# Patient Record
Sex: Female | Born: 2000 | Race: White | Hispanic: No | Marital: Single
Health system: Southern US, Community
[De-identification: ages and names within clinical notes are randomized; demographics above are authoritative.]

---
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
## 2020-04-09 DIAGNOSIS — Z419 Encounter for procedure for purposes other than remedying health state, unspecified: Secondary | ICD-10-CM | POA: Diagnosis not present

## 2020-05-10 DIAGNOSIS — Z419 Encounter for procedure for purposes other than remedying health state, unspecified: Secondary | ICD-10-CM | POA: Diagnosis not present

## 2020-06-10 DIAGNOSIS — Z419 Encounter for procedure for purposes other than remedying health state, unspecified: Secondary | ICD-10-CM | POA: Diagnosis not present

## 2020-07-10 DIAGNOSIS — Z419 Encounter for procedure for purposes other than remedying health state, unspecified: Secondary | ICD-10-CM | POA: Diagnosis not present

## 2020-08-10 DIAGNOSIS — Z419 Encounter for procedure for purposes other than remedying health state, unspecified: Secondary | ICD-10-CM | POA: Diagnosis not present

## 2020-09-09 DIAGNOSIS — Z419 Encounter for procedure for purposes other than remedying health state, unspecified: Secondary | ICD-10-CM | POA: Diagnosis not present

## 2020-10-10 DIAGNOSIS — Z419 Encounter for procedure for purposes other than remedying health state, unspecified: Secondary | ICD-10-CM | POA: Diagnosis not present

## 2020-11-10 DIAGNOSIS — Z419 Encounter for procedure for purposes other than remedying health state, unspecified: Secondary | ICD-10-CM | POA: Diagnosis not present

## 2020-12-08 DIAGNOSIS — Z419 Encounter for procedure for purposes other than remedying health state, unspecified: Secondary | ICD-10-CM | POA: Diagnosis not present

## 2021-01-07 DIAGNOSIS — J4520 Mild intermittent asthma, uncomplicated: Secondary | ICD-10-CM | POA: Diagnosis not present

## 2021-01-07 DIAGNOSIS — Z309 Encounter for contraceptive management, unspecified: Secondary | ICD-10-CM | POA: Diagnosis not present

## 2021-01-07 DIAGNOSIS — Z6823 Body mass index (BMI) 23.0-23.9, adult: Secondary | ICD-10-CM | POA: Diagnosis not present

## 2021-01-07 DIAGNOSIS — F515 Nightmare disorder: Secondary | ICD-10-CM | POA: Diagnosis not present

## 2021-01-07 DIAGNOSIS — Z1331 Encounter for screening for depression: Secondary | ICD-10-CM | POA: Diagnosis not present

## 2021-01-07 DIAGNOSIS — Z6820 Body mass index (BMI) 20.0-20.9, adult: Secondary | ICD-10-CM | POA: Diagnosis not present

## 2021-01-08 DIAGNOSIS — Z419 Encounter for procedure for purposes other than remedying health state, unspecified: Secondary | ICD-10-CM | POA: Diagnosis not present

## 2021-01-23 ENCOUNTER — Emergency Department (HOSPITAL_COMMUNITY)
Admission: EM | Admit: 2021-01-23 | Discharge: 2021-01-24 | Discharge status: Against Medical Advice | Payer: Medicaid Other

## 2021-01-23 NOTE — ED Notes (Signed)
Called x1 for triage with no response

## 2021-01-23 NOTE — ED Notes (Signed)
Not found in lobby at this time.

## 2021-01-23 NOTE — ED Notes (Signed)
Patient called 3x for triage with no response

## 2021-02-07 DIAGNOSIS — Z419 Encounter for procedure for purposes other than remedying health state, unspecified: Secondary | ICD-10-CM | POA: Diagnosis not present

## 2021-03-10 DIAGNOSIS — Z419 Encounter for procedure for purposes other than remedying health state, unspecified: Secondary | ICD-10-CM | POA: Diagnosis not present

## 2021-04-09 DIAGNOSIS — Z419 Encounter for procedure for purposes other than remedying health state, unspecified: Secondary | ICD-10-CM | POA: Diagnosis not present

## 2021-05-10 DIAGNOSIS — Z419 Encounter for procedure for purposes other than remedying health state, unspecified: Secondary | ICD-10-CM | POA: Diagnosis not present

## 2021-06-10 DIAGNOSIS — Z419 Encounter for procedure for purposes other than remedying health state, unspecified: Secondary | ICD-10-CM | POA: Diagnosis not present

## 2021-07-10 DIAGNOSIS — Z419 Encounter for procedure for purposes other than remedying health state, unspecified: Secondary | ICD-10-CM | POA: Diagnosis not present

## 2021-08-10 DIAGNOSIS — Z419 Encounter for procedure for purposes other than remedying health state, unspecified: Secondary | ICD-10-CM | POA: Diagnosis not present

## 2021-09-09 DIAGNOSIS — Z419 Encounter for procedure for purposes other than remedying health state, unspecified: Secondary | ICD-10-CM | POA: Diagnosis not present

## 2021-10-10 DIAGNOSIS — Z419 Encounter for procedure for purposes other than remedying health state, unspecified: Secondary | ICD-10-CM | POA: Diagnosis not present

## 2021-11-10 DIAGNOSIS — Z419 Encounter for procedure for purposes other than remedying health state, unspecified: Secondary | ICD-10-CM | POA: Diagnosis not present

## 2021-12-08 DIAGNOSIS — Z419 Encounter for procedure for purposes other than remedying health state, unspecified: Secondary | ICD-10-CM | POA: Diagnosis not present

## 2022-01-08 DIAGNOSIS — Z419 Encounter for procedure for purposes other than remedying health state, unspecified: Secondary | ICD-10-CM | POA: Diagnosis not present

## 2022-02-07 DIAGNOSIS — Z419 Encounter for procedure for purposes other than remedying health state, unspecified: Secondary | ICD-10-CM | POA: Diagnosis not present

## 2022-03-10 DIAGNOSIS — Z419 Encounter for procedure for purposes other than remedying health state, unspecified: Secondary | ICD-10-CM | POA: Diagnosis not present

## 2022-04-09 DIAGNOSIS — Z419 Encounter for procedure for purposes other than remedying health state, unspecified: Secondary | ICD-10-CM | POA: Diagnosis not present

## 2022-05-10 DIAGNOSIS — Z419 Encounter for procedure for purposes other than remedying health state, unspecified: Secondary | ICD-10-CM | POA: Diagnosis not present

## 2022-06-10 DIAGNOSIS — Z419 Encounter for procedure for purposes other than remedying health state, unspecified: Secondary | ICD-10-CM | POA: Diagnosis not present

## 2022-07-10 DIAGNOSIS — Z419 Encounter for procedure for purposes other than remedying health state, unspecified: Secondary | ICD-10-CM | POA: Diagnosis not present

## 2022-08-10 DIAGNOSIS — Z419 Encounter for procedure for purposes other than remedying health state, unspecified: Secondary | ICD-10-CM | POA: Diagnosis not present

## 2022-09-09 DIAGNOSIS — Z419 Encounter for procedure for purposes other than remedying health state, unspecified: Secondary | ICD-10-CM | POA: Diagnosis not present

## 2022-10-10 DIAGNOSIS — Z419 Encounter for procedure for purposes other than remedying health state, unspecified: Secondary | ICD-10-CM | POA: Diagnosis not present

## 2022-11-10 DIAGNOSIS — Z419 Encounter for procedure for purposes other than remedying health state, unspecified: Secondary | ICD-10-CM | POA: Diagnosis not present

## 2022-12-09 DIAGNOSIS — Z419 Encounter for procedure for purposes other than remedying health state, unspecified: Secondary | ICD-10-CM | POA: Diagnosis not present

## 2023-01-09 DIAGNOSIS — Z419 Encounter for procedure for purposes other than remedying health state, unspecified: Secondary | ICD-10-CM | POA: Diagnosis not present

## 2023-02-08 DIAGNOSIS — Z419 Encounter for procedure for purposes other than remedying health state, unspecified: Secondary | ICD-10-CM | POA: Diagnosis not present

## 2023-03-01 DIAGNOSIS — Z1159 Encounter for screening for other viral diseases: Secondary | ICD-10-CM | POA: Diagnosis not present

## 2023-03-01 DIAGNOSIS — Z9189 Other specified personal risk factors, not elsewhere classified: Secondary | ICD-10-CM | POA: Diagnosis not present

## 2023-03-01 DIAGNOSIS — Z114 Encounter for screening for human immunodeficiency virus [HIV]: Secondary | ICD-10-CM | POA: Diagnosis not present

## 2023-10-08 DIAGNOSIS — R509 Fever, unspecified: Secondary | ICD-10-CM | POA: Diagnosis not present

## 2023-10-08 DIAGNOSIS — H9221 Otorrhagia, right ear: Secondary | ICD-10-CM | POA: Diagnosis not present

## 2023-10-08 DIAGNOSIS — F17210 Nicotine dependence, cigarettes, uncomplicated: Secondary | ICD-10-CM | POA: Diagnosis not present

## 2023-10-08 DIAGNOSIS — I10 Essential (primary) hypertension: Secondary | ICD-10-CM | POA: Diagnosis not present

## 2023-10-08 DIAGNOSIS — H9211 Otorrhea, right ear: Secondary | ICD-10-CM | POA: Diagnosis not present

## 2023-10-08 DIAGNOSIS — H66013 Acute suppurative otitis media with spontaneous rupture of ear drum, bilateral: Secondary | ICD-10-CM | POA: Diagnosis not present

## 2023-10-08 DIAGNOSIS — H9203 Otalgia, bilateral: Secondary | ICD-10-CM | POA: Diagnosis not present

## 2023-10-08 DIAGNOSIS — R21 Rash and other nonspecific skin eruption: Secondary | ICD-10-CM | POA: Diagnosis not present

## 2023-10-08 DIAGNOSIS — H6090 Unspecified otitis externa, unspecified ear: Secondary | ICD-10-CM | POA: Diagnosis not present

## 2023-11-11 DIAGNOSIS — Z419 Encounter for procedure for purposes other than remedying health state, unspecified: Secondary | ICD-10-CM | POA: Diagnosis not present

## 2023-11-13 DIAGNOSIS — R456 Violent behavior: Secondary | ICD-10-CM | POA: Diagnosis not present

## 2023-11-13 DIAGNOSIS — Z743 Need for continuous supervision: Secondary | ICD-10-CM | POA: Diagnosis not present

## 2023-11-13 DIAGNOSIS — G9340 Encephalopathy, unspecified: Secondary | ICD-10-CM | POA: Diagnosis not present

## 2023-11-13 DIAGNOSIS — T40711A Poisoning by cannabis, accidental (unintentional), initial encounter: Secondary | ICD-10-CM | POA: Diagnosis not present

## 2023-11-13 DIAGNOSIS — T424X1A Poisoning by benzodiazepines, accidental (unintentional), initial encounter: Secondary | ICD-10-CM | POA: Diagnosis not present

## 2023-11-13 DIAGNOSIS — R55 Syncope and collapse: Secondary | ICD-10-CM | POA: Diagnosis not present

## 2023-11-13 DIAGNOSIS — T887XXA Unspecified adverse effect of drug or medicament, initial encounter: Secondary | ICD-10-CM | POA: Diagnosis not present

## 2023-11-13 DIAGNOSIS — T405X1A Poisoning by cocaine, accidental (unintentional), initial encounter: Secondary | ICD-10-CM | POA: Diagnosis not present

## 2023-11-14 DIAGNOSIS — R9431 Abnormal electrocardiogram [ECG] [EKG]: Secondary | ICD-10-CM | POA: Diagnosis not present

## 2023-11-14 DIAGNOSIS — G9340 Encephalopathy, unspecified: Secondary | ICD-10-CM | POA: Diagnosis not present

## 2023-11-14 DIAGNOSIS — T40411A Poisoning by fentanyl or fentanyl analogs, accidental (unintentional), initial encounter: Secondary | ICD-10-CM | POA: Diagnosis not present

## 2023-12-09 DIAGNOSIS — Z419 Encounter for procedure for purposes other than remedying health state, unspecified: Secondary | ICD-10-CM | POA: Diagnosis not present

## 2024-01-20 DIAGNOSIS — Z419 Encounter for procedure for purposes other than remedying health state, unspecified: Secondary | ICD-10-CM | POA: Diagnosis not present

## 2024-02-19 DIAGNOSIS — Z419 Encounter for procedure for purposes other than remedying health state, unspecified: Secondary | ICD-10-CM | POA: Diagnosis not present

## 2024-02-27 DIAGNOSIS — Z743 Need for continuous supervision: Secondary | ICD-10-CM | POA: Diagnosis not present

## 2024-02-27 DIAGNOSIS — T50904A Poisoning by unspecified drugs, medicaments and biological substances, undetermined, initial encounter: Secondary | ICD-10-CM | POA: Diagnosis not present

## 2024-02-27 DIAGNOSIS — X58XXXA Exposure to other specified factors, initial encounter: Secondary | ICD-10-CM | POA: Diagnosis not present

## 2024-02-27 DIAGNOSIS — R55 Syncope and collapse: Secondary | ICD-10-CM | POA: Diagnosis not present

## 2024-02-27 DIAGNOSIS — T40601A Poisoning by unspecified narcotics, accidental (unintentional), initial encounter: Secondary | ICD-10-CM | POA: Diagnosis not present

## 2024-02-27 DIAGNOSIS — T887XXA Unspecified adverse effect of drug or medicament, initial encounter: Secondary | ICD-10-CM | POA: Diagnosis not present

## 2024-02-27 DIAGNOSIS — R4182 Altered mental status, unspecified: Secondary | ICD-10-CM | POA: Diagnosis not present

## 2024-02-27 DIAGNOSIS — I498 Other specified cardiac arrhythmias: Secondary | ICD-10-CM | POA: Diagnosis not present

## 2024-03-21 DIAGNOSIS — Z419 Encounter for procedure for purposes other than remedying health state, unspecified: Secondary | ICD-10-CM | POA: Diagnosis not present

## 2024-04-20 DIAGNOSIS — Z419 Encounter for procedure for purposes other than remedying health state, unspecified: Secondary | ICD-10-CM | POA: Diagnosis not present

## 2024-05-21 DIAGNOSIS — Z419 Encounter for procedure for purposes other than remedying health state, unspecified: Secondary | ICD-10-CM | POA: Diagnosis not present
